# Patient Record
Sex: Female | Born: 1985 | Hispanic: No | Marital: Single | Smoking: Former smoker
Health system: Southern US, Community
[De-identification: ages and names within clinical notes are randomized; demographics above are authoritative.]

## PROBLEM LIST (undated history)

## (undated) DIAGNOSIS — Z789 Other specified health status: Secondary | ICD-10-CM

## (undated) HISTORY — PX: WISDOM TOOTH EXTRACTION: SHX21

---
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
## 2004-03-26 ENCOUNTER — Other Ambulatory Visit: Admission: RE | Admit: 2004-03-26 | Discharge: 2004-03-26 | Payer: Self-pay | Admitting: Obstetrics and Gynecology

## 2005-04-10 ENCOUNTER — Other Ambulatory Visit: Admission: RE | Admit: 2005-04-10 | Discharge: 2005-04-10 | Payer: Self-pay | Admitting: Obstetrics and Gynecology

## 2006-07-07 ENCOUNTER — Other Ambulatory Visit: Admission: RE | Admit: 2006-07-07 | Discharge: 2006-07-07 | Payer: Self-pay | Admitting: Obstetrics and Gynecology

## 2013-08-30 LAB — OB RESULTS CONSOLE HEPATITIS B SURFACE ANTIGEN: Hepatitis B Surface Ag: NEGATIVE

## 2013-08-30 LAB — OB RESULTS CONSOLE RPR: RPR: NONREACTIVE

## 2013-08-30 LAB — OB RESULTS CONSOLE HIV ANTIBODY (ROUTINE TESTING): HIV: NONREACTIVE

## 2013-08-30 LAB — OB RESULTS CONSOLE GC/CHLAMYDIA
Chlamydia: NEGATIVE
GC PROBE AMP, GENITAL: NEGATIVE

## 2013-08-30 LAB — OB RESULTS CONSOLE ANTIBODY SCREEN: ANTIBODY SCREEN: NEGATIVE

## 2013-08-30 LAB — OB RESULTS CONSOLE RUBELLA ANTIBODY, IGM: Rubella: IMMUNE

## 2013-08-30 LAB — OB RESULTS CONSOLE ABO/RH: RH Type: NEGATIVE

## 2013-09-05 ENCOUNTER — Inpatient Hospital Stay (HOSPITAL_COMMUNITY): Admission: AD | Admit: 2013-09-05 | Payer: Self-pay | Source: Ambulatory Visit | Admitting: Obstetrics and Gynecology

## 2013-11-11 ENCOUNTER — Encounter (HOSPITAL_COMMUNITY): Payer: Self-pay

## 2013-11-11 ENCOUNTER — Encounter: Payer: Self-pay | Admitting: Pulmonary Disease

## 2013-11-11 ENCOUNTER — Other Ambulatory Visit (HOSPITAL_COMMUNITY): Payer: Self-pay | Admitting: Obstetrics and Gynecology

## 2013-11-11 ENCOUNTER — Ambulatory Visit (HOSPITAL_COMMUNITY)
Admission: RE | Admit: 2013-11-11 | Discharge: 2013-11-11 | Disposition: A | Discharge status: Home / Self Care | Payer: BC Managed Care – PPO | Source: Ambulatory Visit | Attending: Obstetrics and Gynecology | Admitting: Obstetrics and Gynecology

## 2013-11-11 ENCOUNTER — Ambulatory Visit (HOSPITAL_COMMUNITY)
Admission: RE | Admit: 2013-11-11 | Discharge: 2013-11-11 | Disposition: A | Discharge status: Home / Self Care | Payer: BC Managed Care – PPO | Source: Ambulatory Visit | Attending: Pulmonary Disease | Admitting: Pulmonary Disease

## 2013-11-11 ENCOUNTER — Ambulatory Visit (INDEPENDENT_AMBULATORY_CARE_PROVIDER_SITE_OTHER): Payer: BC Managed Care – PPO | Admitting: Pulmonary Disease

## 2013-11-11 VITALS — BP 138/76 | HR 125 | Temp 98.4°F | Ht 62.0 in | Wt 145.0 lb

## 2013-11-11 DIAGNOSIS — O99891 Other specified diseases and conditions complicating pregnancy: Secondary | ICD-10-CM | POA: Insufficient documentation

## 2013-11-11 DIAGNOSIS — R0789 Other chest pain: Secondary | ICD-10-CM

## 2013-11-11 DIAGNOSIS — J9819 Other pulmonary collapse: Secondary | ICD-10-CM | POA: Insufficient documentation

## 2013-11-11 DIAGNOSIS — R079 Chest pain, unspecified: Secondary | ICD-10-CM | POA: Insufficient documentation

## 2013-11-11 DIAGNOSIS — R911 Solitary pulmonary nodule: Secondary | ICD-10-CM | POA: Insufficient documentation

## 2013-11-11 DIAGNOSIS — R0602 Shortness of breath: Secondary | ICD-10-CM | POA: Insufficient documentation

## 2013-11-11 MED ORDER — IOHEXOL 350 MG/ML SOLN
100.0000 mL | Freq: Once | INTRAVENOUS | Status: AC | PRN
  Administered 2013-11-11: 100 mL via INTRAVENOUS

## 2013-11-11 NOTE — Patient Instructions (Signed)
We will call you with the results of your chest ct chest My pager is [phone] If you don't have a blood clot, then use warm compresses and ibuprofen over the weekend to treat the pain If you have fever, chills, nausea or vomiting, go to the ER

## 2013-11-11 NOTE — Progress Notes (Signed)
Subjective:    Patient ID: [name], female    DOB: 23-Jul-1986, 27 y.o.   MRN: [id number]  HPI  Two days ago [name] was doing OK until later in the evening when she started feeling some discomfort in her right chest.  She started feeling pain in her right side.  She called her OBGYN was told that it was due to her abdomen stretching from pregnancy so she was told to take ibuprofen 600mg .  This did not help.  She also tried tylenol yesterday but this did not help.    She describes that pain as a stabbing pain in the right chest.  It is constant.  Doesn't radiate.  Sitting up makes it better and lying flat makes it worse. Turning, movement, deep breath hurts. Coughing, sneezing hurts.  Has not tried warm compresses.  She does not recall any injury or falls.    She does remember kicking a box the night before, but otherwise no heavy lifting, pushing, pulling, no   No leg pain or swelling and no shortness of breath.  She has not had cough, body aches, fever, chills, or runny nose.  She was helping move some things in the office the day before this started, but she did not do any heavy lifting, pulling, pushing.  She remembers kicking a heavy box but she did not feel any pain at the time.  No falls or trauma.    No past medical history on file.   Family History  Problem Relation Age of Onset  . Breast cancer Maternal Aunt     2 maternal aunts     History   Social History  . Marital Status: Single    Spouse Name: N/A    Number of Children: N/A  . Years of Education: N/A   Occupational History  . Not on file.   Social History Main Topics  . Smoking status: Former Smoker -- 0.25 packs/day for 3 years    Types: Cigarettes    Quit date: 06/24/2013  . Smokeless tobacco: Not on file  . Alcohol Use: No  . Drug Use: No  . Sexual Activity: Not on file   Other Topics Concern  . Not on file   Social History Narrative  . No narrative on file     Not on File   No  outpatient prescriptions prior to visit.   No facility-administered medications prior to visit.      Review of Systems  Constitutional: Negative for fever and unexpected weight change.  HENT: Negative for congestion, dental problem, ear pain, nosebleeds, postnasal drip, rhinorrhea, sinus pressure, sneezing, sore throat and trouble swallowing.   Eyes: Negative for redness and itching.  Respiratory: Positive for chest tightness. Negative for cough, shortness of breath and wheezing.   Cardiovascular: Negative for palpitations and leg swelling.  Gastrointestinal: Negative for nausea and vomiting.  Genitourinary: Negative for dysuria.  Musculoskeletal: Negative for joint swelling.  Skin: Negative for rash.  Neurological: Negative for headaches.  Hematological: Does not bruise/bleed easily.  Psychiatric/Behavioral: Negative for dysphoric mood. The patient is not nervous/anxious.        Objective:   Physical Exam  Filed Vitals:   11/11/13 1552  BP: 138/76  Pulse: 125  Temp: 98.4 F (36.9 C)  TempSrc: Oral  Height: 5\' 2"  (1.575 m)  Weight: 145 lb (65.772 kg)  SpO2: 98%   Gen: well appearing, no acute distress HEENT: NCAT, PERRL, EOMi, OP clear, neck supple without masses PULM:  CTA B; mild tenderness R chest wall tenderness CV: RRR, no mgr, no JVD AB: BS+, soft, nontender, no hsm Ext: warm, no edema, no clubbing, no cyanosis Derm: no rash or skin breakdown Neuro: A&Ox4, CN II-XII intact, strength 5/5 in all 4 extremities       Assessment & Plan:   Chest pain I think that the atelectasis in her right chest is indicative of splinting and not the cause of her pain.  The ddx here is broad, including pulmonary embolism, muscle pain, viral pleurisy, and less likely hepatobiliary problems.  She does not have nausea, vomiting or RUQ tenderness to suggest a hepatobiliary problem.  The most concerning possibility in this pregnant female is a pulmonary embolism.  The abrupt onset  and characteristics of the pain are most concerning for this.   If we rule that out then we can proceed with treating conservatively.  Plan: -CT angio chest to rule out PE -if negative, treat conservatively with warm compresses -if positive, admit for lovenox treatment   Updated Medication List Outpatient Encounter Prescriptions as of 11/11/2013  Medication Sig  . acetaminophen (TYLENOL) 325 MG tablet Take 650 mg by mouth as needed.

## 2013-11-11 NOTE — Assessment & Plan Note (Signed)
I think that the atelectasis in her right chest is indicative of splinting and not the cause of her pain.  The ddx here is broad, including pulmonary embolism, muscle pain, viral pleurisy, and less likely hepatobiliary problems.  She does not have nausea, vomiting or RUQ tenderness to suggest a hepatobiliary problem.  The most concerning possibility in this pregnant female is a pulmonary embolism.  The abrupt onset and characteristics of the pain are most concerning for this.   If we rule that out then we can proceed with treating conservatively.  Plan: -CT angio chest to rule out PE -if negative, treat conservatively with warm compresses -if positive, admit for lovenox treatment

## 2013-11-15 ENCOUNTER — Telehealth: Payer: Self-pay | Admitting: Pulmonary Disease

## 2013-11-15 DIAGNOSIS — R911 Solitary pulmonary nodule: Secondary | ICD-10-CM

## 2013-11-15 NOTE — Telephone Encounter (Signed)
I called [name] today to discuss her CT results which showed no pulmonary embolism but did show a small pulmonary nodule.  She needs a CT chest in one year and a follow up visit with me.  Will cc triage to schedule a 11/2014 appointment with me.  I will order the CT chest.

## 2013-12-08 NOTE — L&D Delivery Note (Signed)
Delivery Note At 9:13 AM a viable and healthy female was delivered via Vaginal, Spontaneous Delivery (Presentation: Left Occiput Anterior).  APGAR: 4, 9; weight 5 lb 7.4 oz (2478 g).   Placenta status: Intact, Spontaneous.  Cord: 3 vessels with the following complications: None.    Anesthesia: Epidural  Episiotomy: None Lacerations: B Labial Suture Repair: 3.0 vicryl rapide Est. Blood Loss (mL): 400cc  Mom to postpartum.  Baby to Couplet care / Skin to Skin.  Stuckert, [name] 03/11/2014, 9:40 AM  Br/A neg/RI/Contra?  D/w pt and FOB r/b/a of circumcision for baby, wish to proceed

## 2014-03-03 LAB — OB RESULTS CONSOLE GBS: STREP GROUP B AG: POSITIVE

## 2014-03-10 ENCOUNTER — Encounter (HOSPITAL_COMMUNITY): Payer: Self-pay

## 2014-03-10 ENCOUNTER — Inpatient Hospital Stay (HOSPITAL_COMMUNITY)
Admission: AD | Admit: 2014-03-10 | Discharge: 2014-03-13 | DRG: 775 | Disposition: A | Discharge status: Home / Self Care | Payer: BC Managed Care – PPO | Source: Ambulatory Visit | Attending: Obstetrics and Gynecology | Admitting: Obstetrics and Gynecology

## 2014-03-10 DIAGNOSIS — O99892 Other specified diseases and conditions complicating childbirth: Secondary | ICD-10-CM | POA: Diagnosis present

## 2014-03-10 DIAGNOSIS — O9989 Other specified diseases and conditions complicating pregnancy, childbirth and the puerperium: Secondary | ICD-10-CM

## 2014-03-10 DIAGNOSIS — IMO0001 Reserved for inherently not codable concepts without codable children: Secondary | ICD-10-CM

## 2014-03-10 DIAGNOSIS — Z87891 Personal history of nicotine dependence: Secondary | ICD-10-CM

## 2014-03-10 DIAGNOSIS — Z22330 Carrier of Group B streptococcus: Secondary | ICD-10-CM

## 2014-03-10 HISTORY — DX: Other specified health status: Z78.9

## 2014-03-10 NOTE — MAU Note (Signed)
Pt reports having ctx on and off all night reports feeling wet as well. 2cm in office yesterday

## 2014-03-11 ENCOUNTER — Encounter (HOSPITAL_COMMUNITY): Payer: BC Managed Care – PPO | Admitting: Anesthesiology

## 2014-03-11 ENCOUNTER — Inpatient Hospital Stay (HOSPITAL_COMMUNITY): Payer: BC Managed Care – PPO | Admitting: Anesthesiology

## 2014-03-11 ENCOUNTER — Encounter (HOSPITAL_COMMUNITY): Payer: Self-pay | Admitting: Anesthesiology

## 2014-03-11 DIAGNOSIS — IMO0001 Reserved for inherently not codable concepts without codable children: Secondary | ICD-10-CM

## 2014-03-11 LAB — CBC
HCT: 34 % — ABNORMAL LOW (ref 36.0–46.0)
HEMOGLOBIN: 11.3 g/dL — AB (ref 12.0–15.0)
MCH: 30.4 pg (ref 26.0–34.0)
MCHC: 33.2 g/dL (ref 30.0–36.0)
MCV: 91.4 fL (ref 78.0–100.0)
Platelets: 230 10*3/uL (ref 150–400)
RBC: 3.72 MIL/uL — ABNORMAL LOW (ref 3.87–5.11)
RDW: 13.7 % (ref 11.5–15.5)
WBC: 18.3 10*3/uL — AB (ref 4.0–10.5)

## 2014-03-11 LAB — RPR: RPR Ser Ql: NONREACTIVE

## 2014-03-11 LAB — ABO/RH: ABO/RH(D): A NEG

## 2014-03-11 MED ORDER — FENTANYL 2.5 MCG/ML BUPIVACAINE 1/10 % EPIDURAL INFUSION (WH - ANES)
14.0000 mL/h | INTRAMUSCULAR | Status: DC | PRN
  Filled 2014-03-11: qty 125

## 2014-03-11 MED ORDER — OXYCODONE-ACETAMINOPHEN 5-325 MG PO TABS
1.0000 | ORAL_TABLET | ORAL | Status: DC | PRN
  Filled 2014-03-11: qty 1

## 2014-03-11 MED ORDER — FLEET ENEMA 7-19 GM/118ML RE ENEM: 1.0000 | ENEMA | RECTAL | Status: DC | PRN

## 2014-03-11 MED ORDER — IBUPROFEN 600 MG PO TABS
600.0000 mg | ORAL_TABLET | Freq: Four times a day (QID) | ORAL | Status: DC
  Administered 2014-03-11 – 2014-03-13 (×8): 600 mg via ORAL
  Filled 2014-03-11 (×9): qty 1

## 2014-03-11 MED ORDER — SENNOSIDES-DOCUSATE SODIUM 8.6-50 MG PO TABS
2.0000 | ORAL_TABLET | ORAL | Status: DC
  Administered 2014-03-11 – 2014-03-13 (×2): 2 via ORAL
  Filled 2014-03-11 (×2): qty 2

## 2014-03-11 MED ORDER — DIBUCAINE 1 % RE OINT: 1.0000 "application " | TOPICAL_OINTMENT | RECTAL | Status: DC | PRN

## 2014-03-11 MED ORDER — DEXTROSE 5 % IV SOLN
2.0000 g | Freq: Two times a day (BID) | INTRAVENOUS | Status: AC
  Administered 2014-03-11 (×2): 2 g via INTRAVENOUS
  Filled 2014-03-11 (×2): qty 2

## 2014-03-11 MED ORDER — LACTATED RINGERS IV SOLN
INTRAVENOUS | Status: DC
  Administered 2014-03-11: 01:00:00 via INTRAVENOUS
  Administered 2014-03-11: 500 mL via INTRAVENOUS

## 2014-03-11 MED ORDER — OXYTOCIN 40 UNITS IN LACTATED RINGERS INFUSION - SIMPLE MED: 1.0000 m[IU]/min | INTRAVENOUS | Status: DC

## 2014-03-11 MED ORDER — WITCH HAZEL-GLYCERIN EX PADS: 1.0000 "application " | MEDICATED_PAD | CUTANEOUS | Status: DC | PRN

## 2014-03-11 MED ORDER — PHENYLEPHRINE 40 MCG/ML (10ML) SYRINGE FOR IV PUSH (FOR BLOOD PRESSURE SUPPORT)
80.0000 ug | PREFILLED_SYRINGE | INTRAVENOUS | Status: DC | PRN
  Filled 2014-03-11: qty 2

## 2014-03-11 MED ORDER — ONDANSETRON HCL 4 MG/2ML IJ SOLN
4.0000 mg | Freq: Four times a day (QID) | INTRAMUSCULAR | Status: DC | PRN
  Administered 2014-03-11: 4 mg via INTRAVENOUS
  Filled 2014-03-11: qty 2

## 2014-03-11 MED ORDER — ACETAMINOPHEN 325 MG PO TABS: 650.0000 mg | ORAL_TABLET | ORAL | Status: DC | PRN

## 2014-03-11 MED ORDER — PRENATAL MULTIVITAMIN CH
1.0000 | ORAL_TABLET | Freq: Every day | ORAL | Status: DC
  Administered 2014-03-11 – 2014-03-13 (×3): 1 via ORAL
  Filled 2014-03-11 (×3): qty 1

## 2014-03-11 MED ORDER — BENZOCAINE-MENTHOL 20-0.5 % EX AERO
1.0000 | INHALATION_SPRAY | CUTANEOUS | Status: DC | PRN
Start: 2014-03-11 — End: 2014-03-13
  Administered 2014-03-11: 1 via TOPICAL
  Filled 2014-03-11: qty 56

## 2014-03-11 MED ORDER — LACTATED RINGERS IV SOLN: 500.0000 mL | Freq: Once | INTRAVENOUS | Status: DC

## 2014-03-11 MED ORDER — DIPHENHYDRAMINE HCL 25 MG PO CAPS: 25.0000 mg | ORAL_CAPSULE | Freq: Four times a day (QID) | ORAL | Status: DC | PRN

## 2014-03-11 MED ORDER — LIDOCAINE HCL (PF) 1 % IJ SOLN
INTRAMUSCULAR | Status: DC | PRN
  Administered 2014-03-11 (×2): 8 mL

## 2014-03-11 MED ORDER — LIDOCAINE HCL (PF) 1 % IJ SOLN
30.0000 mL | INTRAMUSCULAR | Status: AC | PRN
  Administered 2014-03-11: 30 mL via SUBCUTANEOUS
  Filled 2014-03-11: qty 30

## 2014-03-11 MED ORDER — OXYCODONE-ACETAMINOPHEN 5-325 MG PO TABS: 1.0000 | ORAL_TABLET | ORAL | Status: DC | PRN

## 2014-03-11 MED ORDER — FENTANYL 2.5 MCG/ML BUPIVACAINE 1/10 % EPIDURAL INFUSION (WH - ANES)
INTRAMUSCULAR | Status: DC | PRN
  Administered 2014-03-11: 14 mL/h via EPIDURAL

## 2014-03-11 MED ORDER — ZOLPIDEM TARTRATE 5 MG PO TABS: 5.0000 mg | ORAL_TABLET | Freq: Every evening | ORAL | Status: DC | PRN

## 2014-03-11 MED ORDER — PENICILLIN G POTASSIUM 5000000 UNITS IJ SOLR
5.0000 10*6.[IU] | Freq: Once | INTRAVENOUS | Status: AC
  Administered 2014-03-11: 5 10*6.[IU] via INTRAVENOUS
  Filled 2014-03-11: qty 5

## 2014-03-11 MED ORDER — ONDANSETRON HCL 4 MG PO TABS: 4.0000 mg | ORAL_TABLET | ORAL | Status: DC | PRN

## 2014-03-11 MED ORDER — ONDANSETRON HCL 4 MG/2ML IJ SOLN: 4.0000 mg | INTRAMUSCULAR | Status: DC | PRN

## 2014-03-11 MED ORDER — SIMETHICONE 80 MG PO CHEW: 80.0000 mg | CHEWABLE_TABLET | ORAL | Status: DC | PRN

## 2014-03-11 MED ORDER — EPHEDRINE 5 MG/ML INJ
10.0000 mg | INTRAVENOUS | Status: DC | PRN
  Filled 2014-03-11: qty 2

## 2014-03-11 MED ORDER — PENICILLIN G POTASSIUM 5000000 UNITS IJ SOLR
2.5000 10*6.[IU] | INTRAVENOUS | Status: DC
  Administered 2014-03-11: 2.5 10*6.[IU] via INTRAVENOUS
  Filled 2014-03-11 (×3): qty 2.5

## 2014-03-11 MED ORDER — PHENYLEPHRINE 40 MCG/ML (10ML) SYRINGE FOR IV PUSH (FOR BLOOD PRESSURE SUPPORT)
80.0000 ug | PREFILLED_SYRINGE | INTRAVENOUS | Status: DC | PRN
  Filled 2014-03-11: qty 10
  Filled 2014-03-11: qty 2

## 2014-03-11 MED ORDER — LACTATED RINGERS IV SOLN: INTRAVENOUS | Status: DC

## 2014-03-11 MED ORDER — TERBUTALINE SULFATE 1 MG/ML IJ SOLN: 0.2500 mg | Freq: Once | INTRAMUSCULAR | Status: DC | PRN

## 2014-03-11 MED ORDER — OXYTOCIN 40 UNITS IN LACTATED RINGERS INFUSION - SIMPLE MED
62.5000 mL/h | INTRAVENOUS | Status: DC
  Filled 2014-03-11: qty 1000

## 2014-03-11 MED ORDER — BUTORPHANOL TARTRATE 1 MG/ML IJ SOLN
2.0000 mg | INTRAMUSCULAR | Status: DC | PRN
  Administered 2014-03-11 (×2): 2 mg via INTRAVENOUS
  Filled 2014-03-11 (×2): qty 2

## 2014-03-11 MED ORDER — IBUPROFEN 600 MG PO TABS
600.0000 mg | ORAL_TABLET | Freq: Four times a day (QID) | ORAL | Status: DC | PRN
  Administered 2014-03-11: 600 mg via ORAL
  Filled 2014-03-11: qty 1

## 2014-03-11 MED ORDER — EPHEDRINE 5 MG/ML INJ
10.0000 mg | INTRAVENOUS | Status: DC | PRN
  Filled 2014-03-11: qty 2
  Filled 2014-03-11: qty 4

## 2014-03-11 MED ORDER — DIPHENHYDRAMINE HCL 50 MG/ML IJ SOLN: 12.5000 mg | INTRAMUSCULAR | Status: DC | PRN

## 2014-03-11 MED ORDER — LACTATED RINGERS IV SOLN: 500.0000 mL | INTRAVENOUS | Status: DC | PRN

## 2014-03-11 MED ORDER — OXYTOCIN BOLUS FROM INFUSION
500.0000 mL | INTRAVENOUS | Status: DC
  Administered 2014-03-11: 500 mL via INTRAVENOUS

## 2014-03-11 MED ORDER — CITRIC ACID-SODIUM CITRATE 334-500 MG/5ML PO SOLN
30.0000 mL | ORAL | Status: DC | PRN
  Administered 2014-03-11: 30 mL via ORAL
  Filled 2014-03-11 (×2): qty 15

## 2014-03-11 MED ORDER — LANOLIN HYDROUS EX OINT: TOPICAL_OINTMENT | CUTANEOUS | Status: DC | PRN

## 2014-03-11 NOTE — Progress Notes (Signed)
Patient ID: Veronica LeversWhitney V Irwin, female   DOB: [birth year], 28 y.o.   MRN: [id number]  AROM for clear fluid w/o diff/comp 9.5/100/0-+1  Anticipate pushing and SVD soon

## 2014-03-11 NOTE — Anesthesia Preprocedure Evaluation (Signed)
Anesthesia Evaluation  Patient identified by MRN, date of birth, ID band Patient awake    Reviewed: Allergy & Precautions, H&P , NPO status , Patient's Chart, lab work & pertinent test results  Airway Mallampati: II TM Distance: >3 FB Neck ROM: full    Dental no notable dental hx.    Pulmonary former smoker,    Pulmonary exam normal       Cardiovascular negative cardio ROS      Neuro/Psych negative neurological ROS  negative psych ROS   GI/Hepatic negative GI ROS, Neg liver ROS,   Endo/Other  negative endocrine ROS  Renal/GU negative Renal ROS     Musculoskeletal   Abdominal Normal abdominal exam  (+)   Peds  Hematology negative hematology ROS (+)   Anesthesia Other Findings   Reproductive/Obstetrics (+) Pregnancy                           Anesthesia Physical Anesthesia Plan  ASA: II  Anesthesia Plan: Epidural   Post-op Pain Management:    Induction:   Airway Management Planned:   Additional Equipment:   Intra-op Plan:   Post-operative Plan:   Informed Consent: I have reviewed the patients History and Physical, chart, labs and discussed the procedure including the risks, benefits and alternatives for the proposed anesthesia with the patient or authorized representative who has indicated his/her understanding and acceptance.     Plan Discussed with:   Anesthesia Plan Comments:         Anesthesia Quick Evaluation  

## 2014-03-11 NOTE — H&P (Signed)
[name] is a 28 y.o. female G1 at 37wk with ctx and cervical change in MAU.  Relatively uncomplicated PNC except + GBBS.  No LOF, no VB.   . Maternal Medical History:  Reason for admission: Contractions.   Contractions: Frequency: regular.    Fetal activity: Perceived fetal activity is normal.    Prenatal complications: no prenatal complications Prenatal Complications - Diabetes: none.    OB History   Grav Para Term Preterm Abortions TAB SAB Ect Mult Living   1             G1 present + abn pap - nl 2014, no STD  Past Medical History  Diagnosis Date  . Medical history non-contributory    Past Surgical History  Procedure Laterality Date  . Wisdom tooth extraction     Family History: family history includes Breast cancer in her maternal aunt. Social History:  reports that she quit smoking about 8 months ago. Her smoking use included Cigarettes. She has a .75 pack-year smoking history. She does not have any smokeless tobacco history on file. She reports that she does not drink alcohol or use illicit drugs. Meds PNV All NKDA   Prenatal Transfer Tool  Maternal Diabetes: No Genetic Screening: Normal Maternal Ultrasounds/Referrals: Normal Fetal Ultrasounds or other Referrals:  None Maternal Substance Abuse:  Yes:  Type: Smoker quit with pregnancy Significant Maternal Medications:  None Significant Maternal Lab Results:  Lab values include: Group B Strep positive Other Comments:  None  Review of Systems  Constitutional: Negative.   HENT: Negative.   Eyes: Negative.   Respiratory: Negative.   Cardiovascular: Negative.   Gastrointestinal: Negative.   Genitourinary: Negative.   Musculoskeletal: Negative.   Skin: Negative.   Neurological: Negative.   Psychiatric/Behavioral: Negative.     Dilation: 8 Effacement (%): 100 Station: -1 Exam by:: GPayne, RN Blood pressure 116/69, pulse 70, temperature 98.7 F (37.1 C), temperature source Oral, resp. rate 20,  height 5\' 2"  (1.575 m), weight 76.204 kg (168 lb), SpO2 98.00%, unknown if currently breastfeeding. Maternal Exam:  Uterine Assessment: Contraction strength is moderate.  Contraction frequency is regular.   Abdomen: Fundal height is appropriate for gestation.   Estimated fetal weight is 7-7.5#.   Fetal presentation: vertex  Introitus: Normal vulva. Normal vagina.  Pelvis: adequate for delivery.   Cervix: Cervix evaluated by digital exam.     Physical Exam  Constitutional: She is oriented to person, place, and time. She appears well-developed and well-nourished.  HENT:  Head: Normocephalic and atraumatic.  Cardiovascular: Normal rate and regular rhythm.   Respiratory: Effort normal and breath sounds normal. No respiratory distress. She has no wheezes.  GI: Soft. Bowel sounds are normal. She exhibits no distension. There is no tenderness.  Musculoskeletal: Normal range of motion.  Neurological: She is alert and oriented to person, place, and time.  Skin: Skin is warm and dry.  Psychiatric: She has a normal mood and affect. Her behavior is normal.    Prenatal labs: ABO, Rh: A/Negative/-- (09/23 0000) Antibody: Negative (09/23 0000) Rubella: Immune (09/23 0000) RPR: Nonreactive (09/23 0000)  HBsAg: Negative (09/23 0000)  HIV: Non-reactive (09/23 0000)  GBS: Positive (03/27 0000)   Hgb 12.5/Pap WNL/ GC neg/ Chl neg/First Tri and AFP WNL/ glucola 120/Plt 289K  US nl anat, ant plac, female  Tdap 01/25/14 Assessment/Plan: 27yo G1P0 at 37 wk in active labor PCN for gbbs prophylaxis Pitocin prn Expect SVD   Stuckert, Navneet Schmuck Bovard 03/11/2014, 7:35 AM

## 2014-03-11 NOTE — MAU Provider Note (Signed)
S: [name] is a 28 y.o. G1P0 at 7937w1d who presents today with contractions and leaking of fluid. She denies any bleeding prior to coming here. RN asked me to evaluate due to presence of increased bleeding after SVE. Patient states that she was 2cm yesterday in the office.  O: VSS FHT: 115-120, moderate with 15x15 accels, one variable seen at the time of this exam Toco: not registering contractions well, but patient reports q3 mins External: no lesion Vagina: small amount of bloody mucous. Once cleaned out the a swab no active bleeding was seen. No pooling of fluid  Cervix: visually about 4cm with a bag visible at the cervical os.  Uterus: AGA A/P: Heavy bloody show Presumed labor at this time RN with report to attending MD

## 2014-03-11 NOTE — Lactation Note (Signed)
This note was copied from the chart of [name]. Lactation Consultation Note  Patient Name: [name] JXBJY'NToday's Date: 03/11/2014 Reason for consult: Follow-up assessment;Difficult latch;Infant < 6lbs Asked by RN to assist Mom with BF. Mom is concerned that baby will not latch, but acts hungry and was satisfied after was given supplement with formula earlier today. Baby sleepy with my visit. Attempted to get baby to suckle on my finger but lots of biting. Explained to Mom that baby did not appear to be hungry at this visit, however both parents report baby was acting hungry prior to my visit and would suckle on their finger. Mom continues to say "I don't have any milk". LC tried to reassure Mom that colostrum is present, reviewed tummy sizes but Mom does not seem to be reassured with teaching. On exam, Mom right nipple is more erect than the left, both have some aerola edema. The left nipple flattens with breast compression. Demonstrated ways to wake baby and attempted to latch on left breast after Mom pre-pumped. Baby would bite down but would not suckle. Mom keeps reporting "I just want my baby to eat". LC attempted a #20 nipple shield and demonstrated to parents how to pre-load nipple shield with formula to supplement. Also demonstrated how to finger feed using curved tipped syringe EBM or formula. Developed a plan for tonight: Mom would pre-pump and attempt to latch baby, if after 5-10 minutes baby would not latch, use nipple shield. If baby will still not latch, FOB to give supplement via curved tipped syringe while Mom post pumps to encourage milk production. Parents seemed to be pleased with this plan. RN present and aware of plan. RN setting up DEBP for Mom to use to pump every 3 hours for 15 minutes. Guidelines for supplementing with BF reviewed with parents. Early term baby behaviors discussed with parents. Encouraged to keep feedings to 30-45 minutes to conserve energy usage. Mom to  call RN with next feeding for RN to evaluate latch especially if using nipple shield.   Maternal Data    Feeding Feeding Type: Breast Fed Length of feed: 0 min (few sucks)  LATCH Score/Interventions Latch: Repeated attempts needed to sustain latch, nipple held in mouth throughout feeding, stimulation needed to elicit sucking reflex. Intervention(s): Adjust position;Assist with latch;Breast massage;Breast compression  Audible Swallowing: None  Type of Nipple: Flat (left flattens with breast compression) Intervention(s): Hand pump  Comfort (Breast/Nipple): Soft / non-tender     Hold (Positioning): Full assist, staff holds infant at breast  LATCH Score: 4  Lactation Tools Discussed/Used Tools: Nipple Dorris CarnesShields;Pump Nipple shield size: 20 Breast pump type: Double-Electric Breast Pump Initiated by:: KG Date initiated:: 03/11/14   Consult Status Consult Status: Follow-up Date: 03/12/14 Follow-up type: In-patient    Veronica LevinsGranger, [name] 03/11/2014, 11:50 PM

## 2014-03-11 NOTE — Anesthesia Procedure Notes (Signed)
Epidural Patient location during procedure: OB Start time: 03/11/2014 6:50 AM End time: 03/11/2014 6:54 AM  Staffing Anesthesiologist: Leilani AbleHATCHETT, Avalyn Molino Performed by: anesthesiologist   Preanesthetic Checklist Completed: patient identified, surgical consent, pre-op evaluation, timeout performed, IV checked, risks and benefits discussed and monitors and equipment checked  Epidural Patient position: sitting Prep: site prepped and draped and DuraPrep Patient monitoring: continuous pulse ox and blood pressure Approach: midline Location: L3-L4 Injection technique: LOR air  Needle:  Needle type: Tuohy  Needle gauge: 17 G Needle length: 9 cm and 9 Needle insertion depth: 5 cm cm Catheter type: closed end flexible Catheter size: 19 Gauge Catheter at skin depth: 11 cm Test dose: negative and Other  Assessment Sensory level: T9 Events: blood not aspirated, injection not painful, no injection resistance, negative IV test and no paresthesia  Additional Notes Reason for block:procedure for pain

## 2014-03-11 NOTE — Consult Note (Signed)
Neonatology Note:  Attendance at Code Apgar:  Our team responded to a Code Apgar call to room # 167 following NSVD, due to infant with apnea. The requesting physician was Dr. Bovard. The mother is a G1P0 A neg, GBS pos at [redacted] weeks GA. She received more than 2 doses of Pen G prior to delivery. She also received a dose of Fentanyl 5 hours before delivery. ROM occurred 1.5 hours PTD and the fluid was clear. At delivery, the baby cried a few times; he was born with a large gush of amniotic fluid. After being placed on the warming table, he became apneic. The OB nursing staff in attendance gave vigorous stimulation and a Code Apgar was called. Our team arrived at 3 minutes of life, at which time the baby was blue and was being given PPV with bag and mask and chest compressions. We continued PPV for another 30 seconds, at which time the baby was showing resp effort and began to cry. We continued BBO2 for another minute and he pinked up quickly. His left lung was clear, but there were a few crackles on the right, so we did chest PT, after which the right side was also clear. He maintained normal O2 saturations in room air and had no signs of resp distress or periodic breathing. Ap pending assignment by OB nurses at 1 minute/9 at 5 min. I spoke with the parents in the DR, then transferred the baby to the Pediatrician's care.  Veronica Canter C. [name], MD  

## 2014-03-12 LAB — CBC
HCT: 28.5 % — ABNORMAL LOW (ref 36.0–46.0)
HEMOGLOBIN: 9.3 g/dL — AB (ref 12.0–15.0)
MCH: 30.3 pg (ref 26.0–34.0)
MCHC: 32.6 g/dL (ref 30.0–36.0)
MCV: 92.8 fL (ref 78.0–100.0)
Platelets: 201 10*3/uL (ref 150–400)
RBC: 3.07 MIL/uL — ABNORMAL LOW (ref 3.87–5.11)
RDW: 14.3 % (ref 11.5–15.5)
WBC: 13.8 10*3/uL — ABNORMAL HIGH (ref 4.0–10.5)

## 2014-03-12 MED ORDER — RHO D IMMUNE GLOBULIN 1500 UNIT/2ML IJ SOLN
300.0000 ug | Freq: Once | INTRAMUSCULAR | Status: AC
  Administered 2014-03-12: 300 ug via INTRAMUSCULAR
  Filled 2014-03-12: qty 2

## 2014-03-12 NOTE — Progress Notes (Addendum)
Post Partum Day 1 Subjective: no complaints, up ad lib, voiding, tolerating PO and nl lochia, pain controlled  Objective: Blood pressure 96/55, pulse 88, temperature 98.7 F (37.1 C), temperature source Oral, resp. rate 18, height 5\' 2"  (1.575 m), weight 76.204 kg (168 lb), SpO2 96.00%, unknown if currently breastfeeding.  Physical Exam:  General: alert and no distress Lochia: appropriate Uterine Fundus: firm   Recent Labs  03/11/14 0110 03/12/14 0615  HGB 11.3* 9.3*  HCT 34.0* 28.5*    Assessment/Plan: Plan for discharge tomorrow, Breastfeeding and Lactation consult.  Routine PP care.  Baby not latching well, will work on today and plan for circ tomorrow.     LOS: 2 days   Stuckert, [name] Bovard 03/12/2014, 8:03 AM

## 2014-03-12 NOTE — Anesthesia Postprocedure Evaluation (Signed)
Anesthesia Post Note  Patient: [name]  Procedure(s) Performed: * No procedures listed *  Anesthesia type: Epidural  Patient location: Mother/Baby  Post pain: Pain level controlled  Post assessment: Post-op Vital signs reviewed  Last Vitals:  Filed Vitals:   03/12/14 0638  BP: 96/55  Pulse: 88  Temp: 37.1 C  Resp: 18    Post vital signs: Reviewed  Level of consciousness:alert  Complications: No apparent anesthesia complications

## 2014-03-13 LAB — RH IG WORKUP (INCLUDES ABO/RH)
ABO/RH(D): A NEG
Antibody Screen: POSITIVE
DAT, IgG: NEGATIVE
FETAL SCREEN: NEGATIVE
GESTATIONAL AGE(WKS): 37.1
Unit division: 0

## 2014-03-13 MED ORDER — OXYCODONE-ACETAMINOPHEN 5-325 MG PO TABS: 1.0000 | ORAL_TABLET | Freq: Four times a day (QID) | ORAL | Status: AC | PRN

## 2014-03-13 MED ORDER — PRENATAL MULTIVITAMIN CH: 1.0000 | ORAL_TABLET | Freq: Every day | ORAL | Status: AC

## 2014-03-13 MED ORDER — IBUPROFEN 800 MG PO TABS: 800.0000 mg | ORAL_TABLET | Freq: Three times a day (TID) | ORAL | Status: AC | PRN

## 2014-03-13 NOTE — Discharge Summary (Signed)
Obstetric Discharge Summary Reason for Admission: onset of labor Prenatal Procedures: none Intrapartum Procedures: spontaneous vaginal delivery Postpartum Procedures: none Complications-Operative and Postpartum: vaginal laceration Hemoglobin  Date Value Ref Range Status  03/12/2014 9.3* 12.0 - 15.0 g/dL Final     REPEATED TO VERIFY     DELTA CHECK NOTED     HCT  Date Value Ref Range Status  03/12/2014 28.5* 36.0 - 46.0 % Final    Physical Exam:  General: alert and no distress Lochia: appropriate Uterine Fundus: firm  Discharge Diagnoses: Term Pregnancy-delivered  Discharge Information: Date: 03/13/2014 Activity: pelvic rest Diet: routine Medications: PNV, Ibuprofen and Percocet Condition: stable Instructions: refer to practice specific booklet Discharge to: home Follow-up Information   Follow up with Stuckert, Sherron MondayJody Bovard, MD. Schedule an appointment as soon as possible for a visit in 6 weeks. (for post-partum check)    Specialty:  Obstetrics and Gynecology   Contact information:   [address] [place] [id number] [phone]       Newborn Data: Live born female  Birth Weight: 5 lb 7.4 oz (2478 g) APGAR: 4, 9  Home with mother.  Stuckert, Reagan Klemz Bovard 03/13/2014, 6:35 AM

## 2014-03-13 NOTE — Progress Notes (Signed)
Post Partum Day 2 Subjective: no complaints, up ad lib, voiding, tolerating PO and nl lochia, pain controlled  Objective: Blood pressure 120/80, pulse 82, temperature 98.2 F (36.8 C), temperature source Oral, resp. rate 18, height 5\' 2"  (1.575 m), weight 76.204 kg (168 lb), SpO2 96.00%, unknown if currently breastfeeding.  Physical Exam:  General: alert and no distress Lochia: appropriate Uterine Fundus: firm   Recent Labs  03/11/14 0110 03/12/14 0615  HGB 11.3* 9.3*  HCT 34.0* 28.5*    Assessment/Plan: Discharge home, Breastfeeding and Lactation consult.  D/c home with motrin, percocet, and pnv, f/u 6 wks.  circ this am.     LOS: 3 days   Irwin, Veronica Yim Bovard 03/13/2014, 6:30 AM

## 2014-03-14 ENCOUNTER — Ambulatory Visit: Payer: Self-pay

## 2014-03-14 NOTE — Lactation Note (Signed)
This note was copied from the chart of Veronica Neal DyWhitney Burdick. Lactation Consultation Note  Patient Name: Veronica Neal DyWhitney Howington WUJWJ'XToday's Date: 03/14/2014 Reason for consult: Follow-up assessment;Late preterm infant;Difficult latch Mom asking about DEBP, has medela backpack model, explained this is appropriate pump. Mom's breasts are filling. Mom is currently feeding baby EBM with bottle due to difficulty latching baby. Baby sleepy and satisfied from previous bottle. Reviewed BF basics with mom, enc mom to offer baby breast first when showing feeding cues. Reviewed how to achieve a deep latch. Enc mom to tug lower chin after baby latches on as it appears baby upper lip and gums extend naturally past lower lip and gums. Offered mom a follow-up appointment with LC, but mom prefers to call back. Reviewed engorgement prevention/treatment. Referred mom to BF information including storage times in [facility] BF brochure. Enc mom to call with any questions. Consulted with patient's nurse on recommendations. Mom comfortable with hand expression, and aware of OP and BFSG services.  Maternal Data    Feeding Feeding Type:  (Baby just given EBM via bottle.)  LATCH Score/Interventions          Comfort (Breast/Nipple): Filling, red/small blisters or bruises, mild/mod discomfort (Mom's breasts are filling, reviewed engorgement prevention/treatment.)           Lactation Tools Discussed/Used Pump Review: Milk Storage   Consult Status Consult Status: Complete Follow-up type:  (Offered to make follow-up apt, mom prefers to call back after she gets home.)    Nancy NordmannWILLIARD, [name] 03/14/2014, 10:20 AM

## 2014-10-09 ENCOUNTER — Encounter (HOSPITAL_COMMUNITY): Payer: Self-pay | Admitting: Anesthesiology

## 2014-11-16 ENCOUNTER — Ambulatory Visit (INDEPENDENT_AMBULATORY_CARE_PROVIDER_SITE_OTHER)
Admission: RE | Admit: 2014-11-16 | Discharge: 2014-11-16 | Disposition: A | Discharge status: Home / Self Care | Payer: BC Managed Care – PPO | Source: Ambulatory Visit | Attending: Pulmonary Disease | Admitting: Pulmonary Disease

## 2014-11-16 ENCOUNTER — Inpatient Hospital Stay: Admission: RE | Admit: 2014-11-16 | Payer: BC Managed Care – PPO | Source: Ambulatory Visit

## 2014-11-16 DIAGNOSIS — R911 Solitary pulmonary nodule: Secondary | ICD-10-CM

## 2014-11-17 ENCOUNTER — Telehealth: Payer: Self-pay | Admitting: Pulmonary Disease

## 2014-11-17 NOTE — Telephone Encounter (Signed)
Called and spoke with pt and she is aware  That BQ has not reviewed the ct results yet.  Will forward message to him to see about these results.  BQ please advise. Thanks  No Known Allergies  Current Outpatient Prescriptions on File Prior to Visit  Medication Sig Dispense Refill  . acetaminophen (TYLENOL) 325 MG tablet Take 650 mg by mouth as needed for mild pain.     Marland Kitchen. ibuprofen (ADVIL,MOTRIN) 800 MG tablet Take 1 tablet (800 mg total) by mouth every 8 (eight) hours as needed. 45 tablet 1  . oxyCODONE-acetaminophen (PERCOCET/ROXICET) 5-325 MG per tablet Take 1-2 tablets by mouth every 6 (six) hours as needed for severe pain (moderate - severe pain). 15 tablet 0  . Prenatal Vit-Fe Fumarate-FA (PRENATAL MULTIVITAMIN) TABS tablet Take 1 tablet by mouth daily at 12 noon. 30 tablet 12   No current facility-administered medications on file prior to visit.

## 2014-11-23 NOTE — Progress Notes (Signed)
Quick Note:  Pt aware of results and recs. Nothing further needed. ______ 

## 2014-11-23 NOTE — Telephone Encounter (Signed)
Updated by Morrie SheldonAshley 12/17

## 2015-01-14 IMAGING — CT CT ANGIO CHEST
2 of 6 series · 19 of 36 positions shown · IV contrast (OMNIPAQUE)
Comparison: Chest radiographs obtained earlier today.

CLINICAL DATA: Sudden onset right chest pain and shortness of
breath. Twenty weeks pregnant.

EXAM:
CT ANGIOGRAPHY CHEST WITH CONTRAST
TECHNIQUE: Multidetector CT imaging of the chest was performed using the
standard protocol during bolus administration of intravenous
contrast. Multiplanar CT image reconstructions including MIPs were
obtained to evaluate the vascular anatomy.
CONTRAST:  100mL OMNIPAQUE IOHEXOL 350 MG/ML SOLN

[Series 6: pe thins @ 1mm · axial · 0.74mm/px · z∈[-389,-168]mm · 18 of 247 slices shown]
[im 13/247  lung]
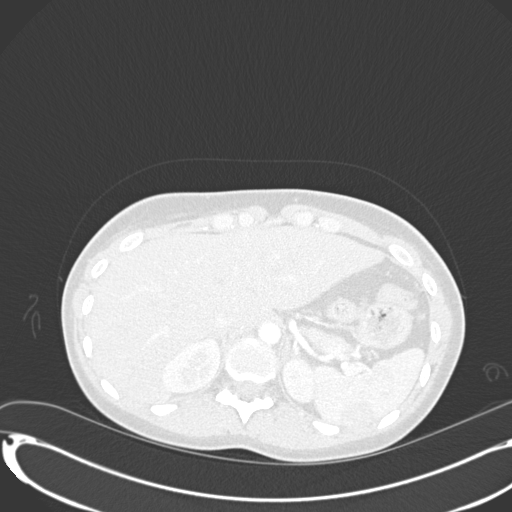
[im 25/247  mediastinal]
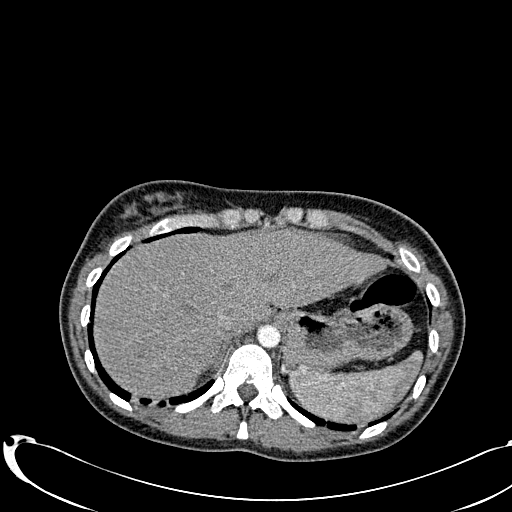
[im 37/247  lung]
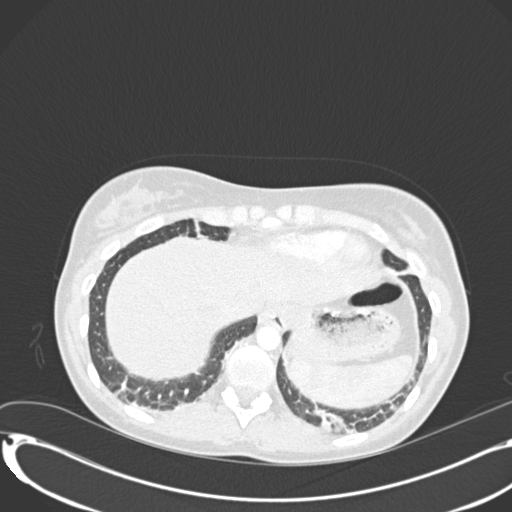
[im 50/247  mediastinal]
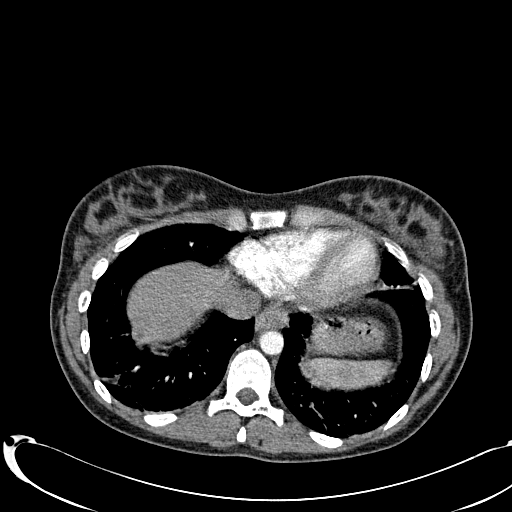
[im 62/247  lung]
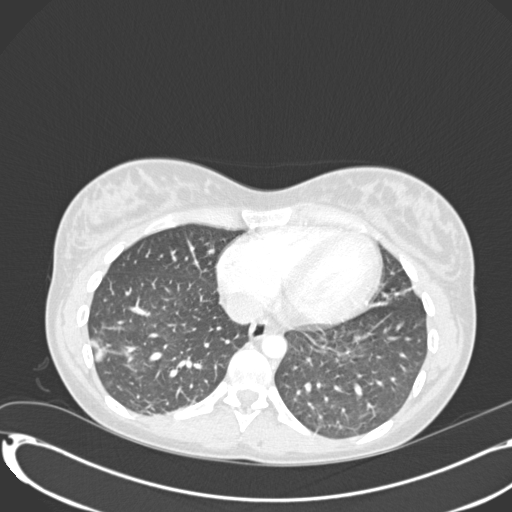
[im 74/247  mediastinal]
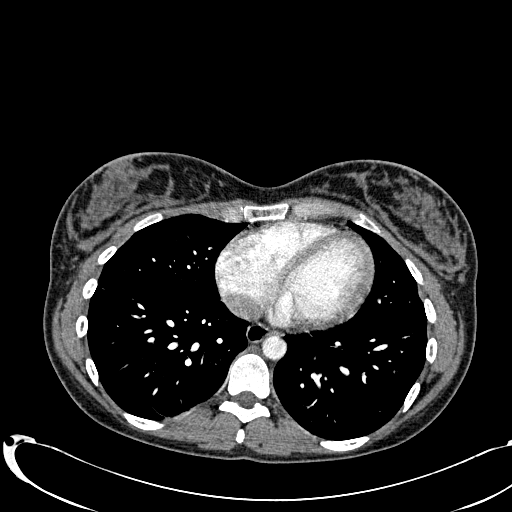
[im 87/247  lung]
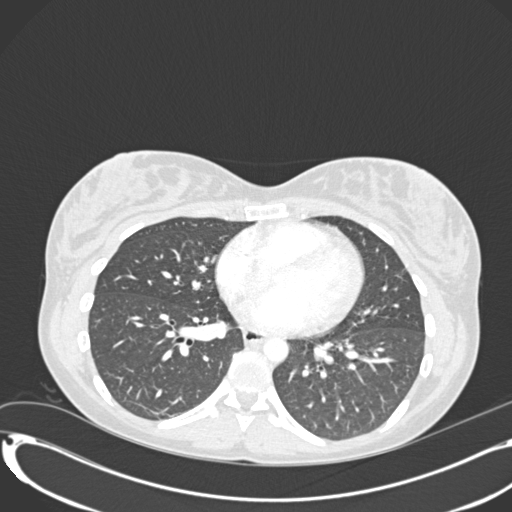
[im 99/247  mediastinal]
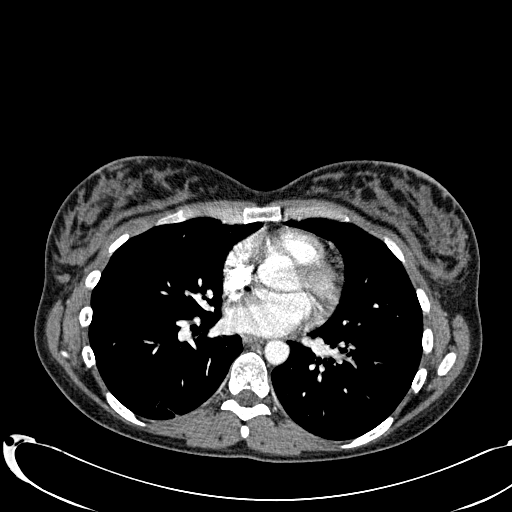
[im 111/247  lung]
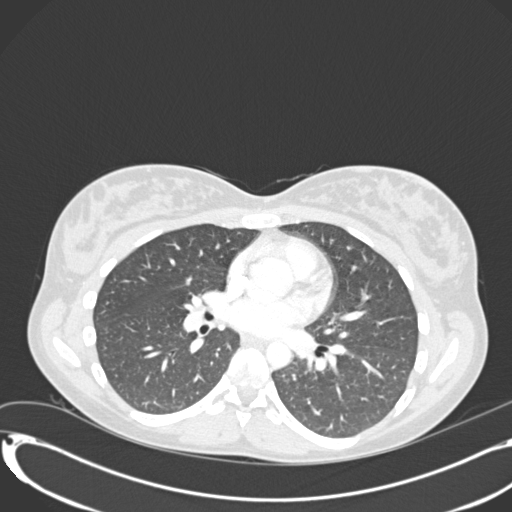
[im 136/247  mediastinal]
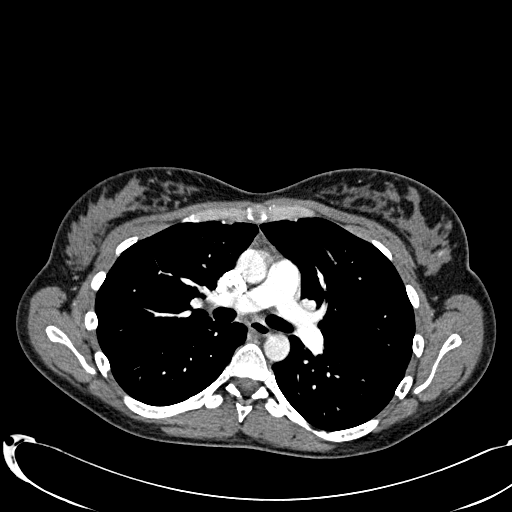
[im 148/247  lung]
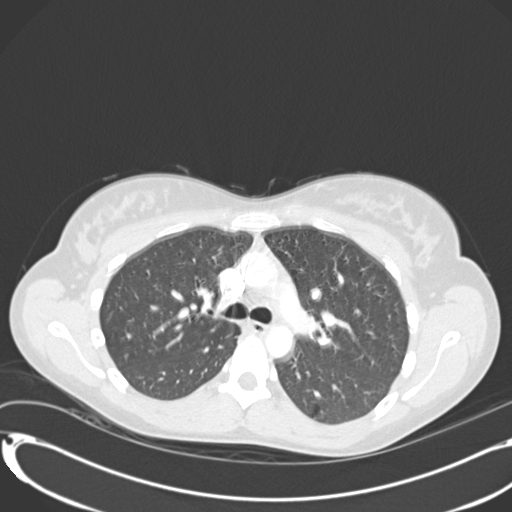
[im 160/247  mediastinal]
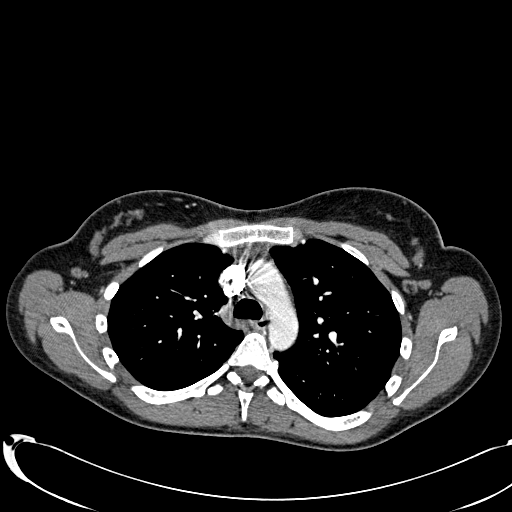
[im 173/247  lung]
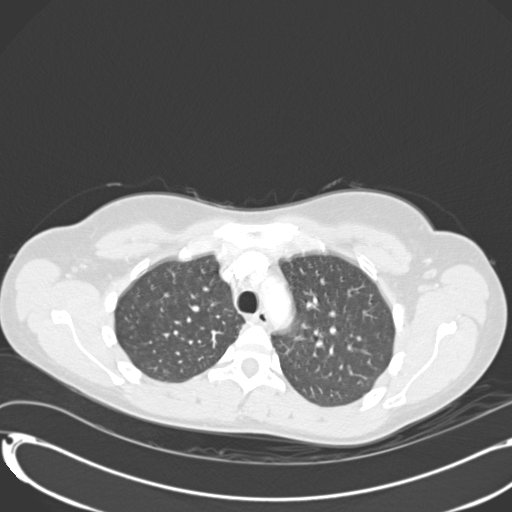
[im 185/247  mediastinal]
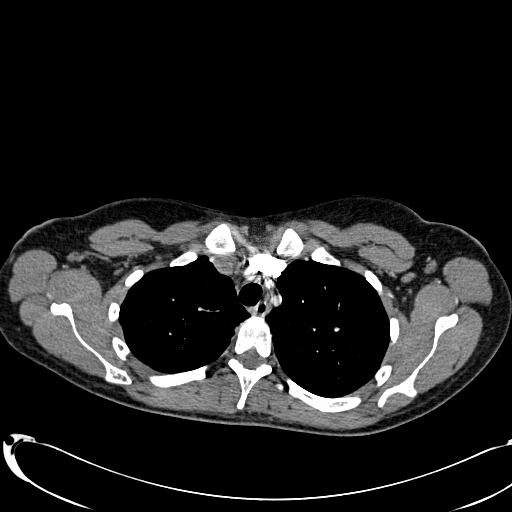
[im 197/247  lung]
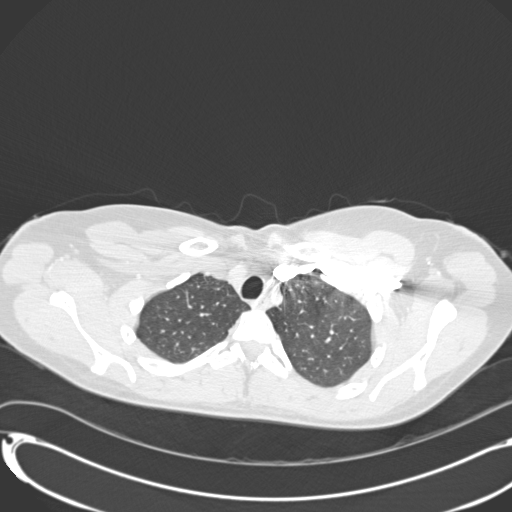
[im 210/247  mediastinal]
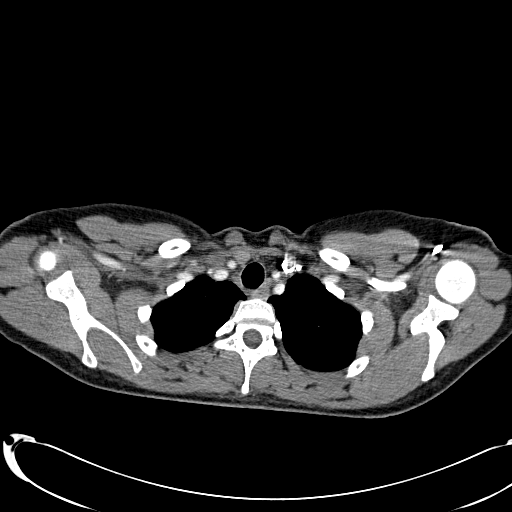
[im 222/247  lung]
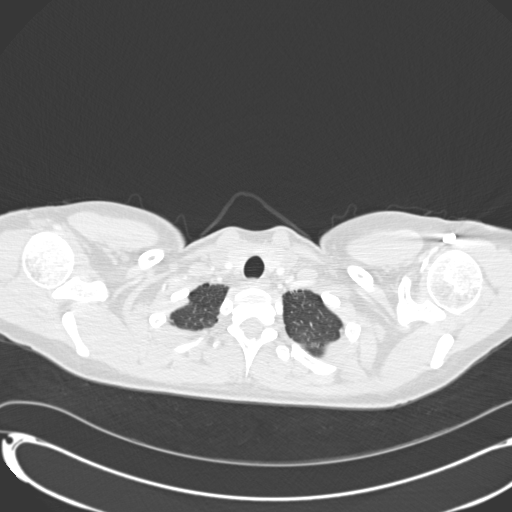
[im 234/247  mediastinal]
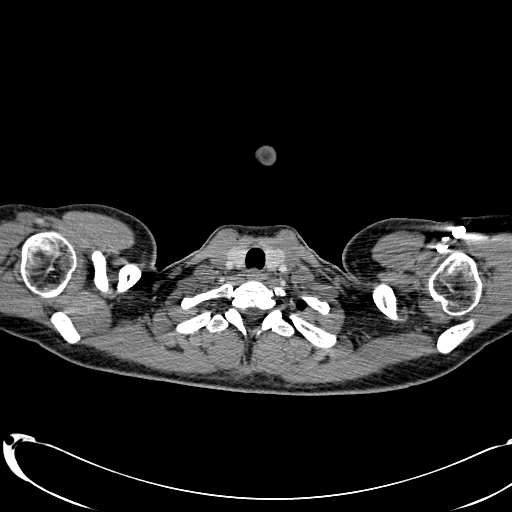

[Series 602: <mpr thick range> · coronal · 0.74mm/px · 1 of 99 slices shown]
[im 50/99  mediastinal]
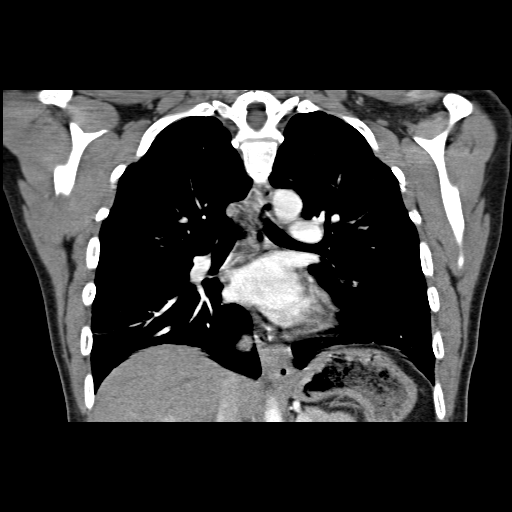

[19 of 36 positions shown; findings below may reference images not displayed]

FINDINGS: Normally opacified pulmonary arteries with no pulmonary arterial
filling defects. Linear densities at both lung bases. Small bulla in
the superior segment of the left lower lobe. Nearby 5 mm
noncalcified nodule in the superior segment of the left lower lobe
on image number 39. No other lung nodules and no enlarged lymph
nodes. Mild thoracic spine degenerative changes. Unremarkable upper
abdomen.

Review of the MIP images confirms the above findings.
IMPRESSION: 1. No pulmonary emboli.
2. Mild bibasilar linear atelectasis or scarring.
3. 5 mm noncalcified left lower lobe nodule. If the patient is at
high risk for bronchogenic carcinoma, follow-up chest CT at 6-12
months is recommended. If the patient is at low risk for
bronchogenic carcinoma, follow-up chest CT at 12 months is
recommended. This recommendation follows the consensus statement:
Guidelines for Management of Small Pulmonary Nodules Detected on CT
Scans: A Statement from the [HOSPITAL] as published in

## 2018-12-08 NOTE — L&D Delivery Note (Signed)
Delivery Note Pt pushed for 84mins and at 5:02 PM a viable female was delivered via Vaginal, Spontaneous (Presentation: ROA;  ).  APGAR: 9, 9; weight pending   Placenta status: delivered intact; duncan, .  Cord:3vc  with the following complications: none.  Cord pH: n/a  Anesthesia: Epidural  Episiotomy:  none Lacerations: None Est. Blood Loss (mL): 100  Mom to postpartum.  Baby to Couplet care / Skin to Skin  Desires circumcision in hospital.  [name] 06/24/2019, 5:16 PM

## 2018-12-15 LAB — OB RESULTS CONSOLE HEPATITIS B SURFACE ANTIGEN: Hepatitis B Surface Ag: NEGATIVE

## 2018-12-15 LAB — OB RESULTS CONSOLE RPR: RPR: NONREACTIVE

## 2018-12-15 LAB — OB RESULTS CONSOLE HIV ANTIBODY (ROUTINE TESTING): HIV: NONREACTIVE

## 2018-12-15 LAB — OB RESULTS CONSOLE GC/CHLAMYDIA
Chlamydia: NEGATIVE
Gonorrhea: NEGATIVE

## 2018-12-15 LAB — OB RESULTS CONSOLE RUBELLA ANTIBODY, IGM: Rubella: IMMUNE

## 2019-06-08 LAB — OB RESULTS CONSOLE GBS: GBS: NEGATIVE

## 2019-06-16 ENCOUNTER — Telehealth (HOSPITAL_COMMUNITY): Payer: Self-pay | Admitting: *Deleted

## 2019-06-16 ENCOUNTER — Encounter (HOSPITAL_COMMUNITY): Payer: Self-pay | Admitting: *Deleted

## 2019-06-16 NOTE — Telephone Encounter (Signed)
Preadmission screen  

## 2019-06-24 ENCOUNTER — Encounter (HOSPITAL_COMMUNITY): Payer: Self-pay | Admitting: *Deleted

## 2019-06-24 ENCOUNTER — Inpatient Hospital Stay (HOSPITAL_COMMUNITY)
Admission: AD | Admit: 2019-06-24 | Discharge: 2019-06-25 | DRG: 807 | Disposition: A | Discharge status: Home / Self Care | Payer: 59 | Attending: Obstetrics and Gynecology | Admitting: Obstetrics and Gynecology

## 2019-06-24 ENCOUNTER — Inpatient Hospital Stay (HOSPITAL_COMMUNITY): Payer: 59 | Admitting: Anesthesiology

## 2019-06-24 ENCOUNTER — Other Ambulatory Visit (HOSPITAL_COMMUNITY)
Admission: RE | Admit: 2019-06-24 | Discharge: 2019-06-24 | Disposition: A | Discharge status: Home / Self Care | Payer: 59 | Source: Ambulatory Visit | Attending: Obstetrics and Gynecology | Admitting: Obstetrics and Gynecology

## 2019-06-24 ENCOUNTER — Other Ambulatory Visit: Payer: Self-pay

## 2019-06-24 DIAGNOSIS — Z1159 Encounter for screening for other viral diseases: Secondary | ICD-10-CM | POA: Diagnosis present

## 2019-06-24 DIAGNOSIS — Z87891 Personal history of nicotine dependence: Secondary | ICD-10-CM

## 2019-06-24 DIAGNOSIS — Z3A38 38 weeks gestation of pregnancy: Secondary | ICD-10-CM | POA: Diagnosis not present

## 2019-06-24 DIAGNOSIS — O26893 Other specified pregnancy related conditions, third trimester: Secondary | ICD-10-CM | POA: Diagnosis present

## 2019-06-24 LAB — CBC
HCT: 36.8 % (ref 36.0–46.0)
Hemoglobin: 12 g/dL (ref 12.0–15.0)
MCH: 30.6 pg (ref 26.0–34.0)
MCHC: 32.6 g/dL (ref 30.0–36.0)
MCV: 93.9 fL (ref 80.0–100.0)
Platelets: 245 10*3/uL (ref 150–400)
RBC: 3.92 MIL/uL (ref 3.87–5.11)
RDW: 14.6 % (ref 11.5–15.5)
WBC: 17.3 10*3/uL — ABNORMAL HIGH (ref 4.0–10.5)
nRBC: 0 % (ref 0.0–0.2)

## 2019-06-24 LAB — POCT FERN TEST: POCT Fern Test: POSITIVE

## 2019-06-24 LAB — SARS CORONAVIRUS 2 (TAT 6-24 HRS): SARS Coronavirus 2: NEGATIVE

## 2019-06-24 LAB — SARS CORONAVIRUS 2 BY RT PCR (HOSPITAL ORDER, PERFORMED IN ~~LOC~~ HOSPITAL LAB): SARS Coronavirus 2: NEGATIVE

## 2019-06-24 MED ORDER — IBUPROFEN 600 MG PO TABS
600.0000 mg | ORAL_TABLET | Freq: Four times a day (QID) | ORAL | Status: DC
  Administered 2019-06-25 (×3): 600 mg via ORAL
  Filled 2019-06-24 (×3): qty 1

## 2019-06-24 MED ORDER — ONDANSETRON HCL 4 MG PO TABS: 4.0000 mg | ORAL_TABLET | ORAL | Status: DC | PRN

## 2019-06-24 MED ORDER — OXYCODONE HCL 5 MG PO TABS: 10.0000 mg | ORAL_TABLET | ORAL | Status: DC | PRN

## 2019-06-24 MED ORDER — OXYCODONE-ACETAMINOPHEN 5-325 MG PO TABS: 1.0000 | ORAL_TABLET | ORAL | Status: DC | PRN

## 2019-06-24 MED ORDER — LACTATED RINGERS IV SOLN
500.0000 mL | Freq: Once | INTRAVENOUS | Status: AC
  Administered 2019-06-24: 500 mL via INTRAVENOUS

## 2019-06-24 MED ORDER — PHENYLEPHRINE 40 MCG/ML (10ML) SYRINGE FOR IV PUSH (FOR BLOOD PRESSURE SUPPORT)
80.0000 ug | PREFILLED_SYRINGE | INTRAVENOUS | Status: DC | PRN
  Filled 2019-06-24 (×2): qty 10

## 2019-06-24 MED ORDER — SOD CITRATE-CITRIC ACID 500-334 MG/5ML PO SOLN: 30.0000 mL | ORAL | Status: DC | PRN

## 2019-06-24 MED ORDER — DIPHENHYDRAMINE HCL 25 MG PO CAPS: 25.0000 mg | ORAL_CAPSULE | Freq: Four times a day (QID) | ORAL | Status: DC | PRN

## 2019-06-24 MED ORDER — DIBUCAINE (PERIANAL) 1 % EX OINT: 1.0000 "application " | TOPICAL_OINTMENT | CUTANEOUS | Status: DC | PRN

## 2019-06-24 MED ORDER — ACETAMINOPHEN 325 MG PO TABS: 650.0000 mg | ORAL_TABLET | ORAL | Status: DC | PRN

## 2019-06-24 MED ORDER — WITCH HAZEL-GLYCERIN EX PADS: 1.0000 "application " | MEDICATED_PAD | CUTANEOUS | Status: DC | PRN

## 2019-06-24 MED ORDER — OXYTOCIN BOLUS FROM INFUSION
500.0000 mL | Freq: Once | INTRAVENOUS | Status: AC
  Administered 2019-06-24: 500 mL via INTRAVENOUS

## 2019-06-24 MED ORDER — EPHEDRINE 5 MG/ML INJ
10.0000 mg | INTRAVENOUS | Status: DC | PRN
  Filled 2019-06-24: qty 2

## 2019-06-24 MED ORDER — FENTANYL-BUPIVACAINE-NACL 0.5-0.125-0.9 MG/250ML-% EP SOLN
12.0000 mL/h | EPIDURAL | Status: DC | PRN
  Filled 2019-06-24: qty 250

## 2019-06-24 MED ORDER — BUTORPHANOL TARTRATE 1 MG/ML IJ SOLN
1.0000 mg | Freq: Once | INTRAMUSCULAR | Status: AC
  Administered 2019-06-24: 1 mg via INTRAVENOUS
  Filled 2019-06-24: qty 1

## 2019-06-24 MED ORDER — DIPHENHYDRAMINE HCL 50 MG/ML IJ SOLN: 12.5000 mg | INTRAMUSCULAR | Status: DC | PRN

## 2019-06-24 MED ORDER — SODIUM CHLORIDE (PF) 0.9 % IJ SOLN
INTRAMUSCULAR | Status: DC | PRN
  Administered 2019-06-24: 12 mL/h via EPIDURAL

## 2019-06-24 MED ORDER — TETANUS-DIPHTH-ACELL PERTUSSIS 5-2.5-18.5 LF-MCG/0.5 IM SUSP: 0.5000 mL | Freq: Once | INTRAMUSCULAR | Status: DC

## 2019-06-24 MED ORDER — LIDOCAINE HCL (PF) 1 % IJ SOLN
INTRAMUSCULAR | Status: DC | PRN
  Administered 2019-06-24 (×2): 5 mL via EPIDURAL

## 2019-06-24 MED ORDER — ONDANSETRON HCL 4 MG/2ML IJ SOLN: 4.0000 mg | INTRAMUSCULAR | Status: DC | PRN

## 2019-06-24 MED ORDER — BENZOCAINE-MENTHOL 20-0.5 % EX AERO: 1.0000 "application " | INHALATION_SPRAY | CUTANEOUS | Status: DC | PRN

## 2019-06-24 MED ORDER — EPHEDRINE 5 MG/ML INJ: 10.0000 mg | INTRAVENOUS | Status: DC | PRN

## 2019-06-24 MED ORDER — PRENATAL MULTIVITAMIN CH
1.0000 | ORAL_TABLET | Freq: Every day | ORAL | Status: DC
  Administered 2019-06-25: 1 via ORAL
  Filled 2019-06-24: qty 1

## 2019-06-24 MED ORDER — LACTATED RINGERS IV SOLN: 500.0000 mL | INTRAVENOUS | Status: DC | PRN

## 2019-06-24 MED ORDER — LACTATED RINGERS IV SOLN: 500.0000 mL | Freq: Once | INTRAVENOUS | Status: DC

## 2019-06-24 MED ORDER — OXYCODONE HCL 5 MG PO TABS: 5.0000 mg | ORAL_TABLET | ORAL | Status: DC | PRN

## 2019-06-24 MED ORDER — ONDANSETRON HCL 4 MG/2ML IJ SOLN: 4.0000 mg | Freq: Four times a day (QID) | INTRAMUSCULAR | Status: DC | PRN

## 2019-06-24 MED ORDER — ZOLPIDEM TARTRATE 5 MG PO TABS: 5.0000 mg | ORAL_TABLET | Freq: Every evening | ORAL | Status: DC | PRN

## 2019-06-24 MED ORDER — COCONUT OIL OIL: 1.0000 "application " | TOPICAL_OIL | Status: DC | PRN

## 2019-06-24 MED ORDER — LACTATED RINGERS IV SOLN
INTRAVENOUS | Status: DC
  Administered 2019-06-24: 14:00:00 via INTRAVENOUS

## 2019-06-24 MED ORDER — SIMETHICONE 80 MG PO CHEW: 80.0000 mg | CHEWABLE_TABLET | ORAL | Status: DC | PRN

## 2019-06-24 MED ORDER — LIDOCAINE HCL (PF) 1 % IJ SOLN: 30.0000 mL | INTRAMUSCULAR | Status: DC | PRN

## 2019-06-24 MED ORDER — FLEET ENEMA 7-19 GM/118ML RE ENEM: 1.0000 | ENEMA | RECTAL | Status: DC | PRN

## 2019-06-24 MED ORDER — SENNOSIDES-DOCUSATE SODIUM 8.6-50 MG PO TABS
2.0000 | ORAL_TABLET | ORAL | Status: DC
  Administered 2019-06-25: 01:00:00 2 via ORAL
  Filled 2019-06-24: qty 2

## 2019-06-24 MED ORDER — OXYTOCIN 40 UNITS IN NORMAL SALINE INFUSION - SIMPLE MED
2.5000 [IU]/h | INTRAVENOUS | Status: DC
  Filled 2019-06-24: qty 1000

## 2019-06-24 MED ORDER — PHENYLEPHRINE 40 MCG/ML (10ML) SYRINGE FOR IV PUSH (FOR BLOOD PRESSURE SUPPORT): 80.0000 ug | PREFILLED_SYRINGE | INTRAVENOUS | Status: DC | PRN

## 2019-06-24 MED ORDER — OXYCODONE-ACETAMINOPHEN 5-325 MG PO TABS: 2.0000 | ORAL_TABLET | ORAL | Status: DC | PRN

## 2019-06-24 NOTE — Anesthesia Procedure Notes (Signed)
Epidural Patient location during procedure: OB Start time: 06/24/2019 2:41 PM End time: 06/24/2019 2:50 PM  Staffing Anesthesiologist: Albertha Ghee, MD Performed: anesthesiologist   Preanesthetic Checklist Completed: patient identified, site marked, pre-op evaluation, timeout performed, IV checked, risks and benefits discussed and monitors and equipment checked  Epidural Patient position: sitting Prep: DuraPrep Patient monitoring: heart rate, cardiac monitor, continuous pulse ox and blood pressure Approach: midline Location: L2-L3 Injection technique: LOR saline  Needle:  Needle type: Tuohy  Needle gauge: 17 G Needle length: 9 cm Needle insertion depth: 5 cm Catheter type: closed end flexible Catheter size: 19 Gauge Catheter at skin depth: 11 cm Test dose: negative and Other  Assessment Events: blood not aspirated, injection not painful, no injection resistance and negative IV test  Additional Notes Informed consent obtained prior to proceeding including risk of failure, 1% risk of PDPH, risk of minor discomfort and bruising.  Discussed rare but serious complications including epidural abscess, permanent nerve injury, epidural hematoma.  Discussed alternatives to epidural analgesia and patient desires to proceed.  Timeout performed pre-procedure verifying patient name, procedure, and platelet count.  Patient tolerated procedure well. Reason for block:procedure for pain

## 2019-06-24 NOTE — Anesthesia Preprocedure Evaluation (Signed)
Anesthesia Evaluation  Patient identified by MRN, date of birth, ID band Patient awake    Reviewed: Allergy & Precautions, H&P , NPO status , Patient's Chart, lab work & pertinent test results  Airway Mallampati: II   Neck ROM: full    Dental   Pulmonary former smoker,    breath sounds clear to auscultation       Cardiovascular negative cardio ROS   Rhythm:regular Rate:Normal     Neuro/Psych    GI/Hepatic   Endo/Other    Renal/GU      Musculoskeletal   Abdominal   Peds  Hematology   Anesthesia Other Findings   Reproductive/Obstetrics (+) Pregnancy                             Anesthesia Physical Anesthesia Plan  ASA: II  Anesthesia Plan: Epidural   Post-op Pain Management:    Induction: Intravenous  PONV Risk Score and Plan: 2 and Treatment may vary due to age or medical condition  Airway Management Planned: Natural Airway  Additional Equipment:   Intra-op Plan:   Post-operative Plan:   Informed Consent: I have reviewed the patients History and Physical, chart, labs and discussed the procedure including the risks, benefits and alternatives for the proposed anesthesia with the patient or authorized representative who has indicated his/her understanding and acceptance.       Plan Discussed with: Anesthesiologist  Anesthesia Plan Comments:         Anesthesia Quick Evaluation  

## 2019-06-24 NOTE — MAU Note (Signed)
Covid swab collected. Pt tolerated well. PT asymptomatic 

## 2019-06-24 NOTE — H&P (Signed)
[name] is a 33 y.o.G2P1001 female presenting at 5138 5/7wks for painful contractions. Pt noted to be 5cm dilated. Admitted for labor. Pt progressed well spontaneously and is now complete and comfortable with epidural. She is dated per LMP which was confirmed with a first trimester US. 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 Measured S> D but US wnl. GBS is negative. Sars covid neg OB History    Gravida  2   Para  1   Term  1   Preterm      AB      Living  1     SAB      TAB      Ectopic      Multiple      Live Births  1          Past Medical History:  Diagnosis Date  . Medical history non-contributory   . SVD (spontaneous vaginal delivery) 03/11/2014   Past Surgical History:  Procedure Laterality Date  . WISDOM TOOTH EXTRACTION     Family History: family history includes Breast cancer in her maternal aunt. Social History:  reports that she quit smoking about 6 years ago. Her smoking use included cigarettes. She has a 0.75 pack-year smoking history. She has never used smokeless tobacco. She reports that she does not drink alcohol or use drugs.     Maternal Diabetes: No Genetic Screening: Abnormal:  Results: Other:see hpi Maternal Ultrasounds/Referrals: Normal Fetal Ultrasounds or other Referrals:  None Maternal Substance Abuse:  No Significant Maternal Medications:  None Significant Maternal Lab Results:  Group B Strep negative Other Comments:  None  Review of Systems  Constitutional: Negative for chills, fever, malaise/fatigue and weight loss.  Eyes: Negative for blurred vision and double vision.  Respiratory: Negative for shortness of breath.   Cardiovascular: Negative for chest pain.  Gastrointestinal: Positive for abdominal pain. Negative for heartburn, nausea and vomiting.  Genitourinary: Negative for dysuria.  Musculoskeletal: Negative for myalgias.  Skin: Negative for itching and rash.  Neurological:  Negative for dizziness and headaches.  Endo/Heme/Allergies: Does not bruise/bleed easily.  Psychiatric/Behavioral: Negative for depression, hallucinations, substance abuse and suicidal ideas. The patient is not nervous/anxious.    Maternal Medical History:  Reason for admission: Contractions.  Nausea.  Contractions: Onset was 3-5 hours ago.    Fetal activity: Perceived fetal activity is normal.   Last perceived fetal movement was within the past hour.    Prenatal Complications - Diabetes: none.    Dilation: 10 Effacement (%): 100 Station: 0, Plus 1 Exam by:: Earlene Plateravis, RN  Blood pressure (!) 120/49, pulse 80, temperature 98.2 F (36.8 C), resp. rate 16, height 5\' 1"  (1.549 m), weight 78 kg, last menstrual period 09/26/2018, unknown if currently breastfeeding. Maternal Exam:  Uterine Assessment: Contraction strength is firm.  Contraction frequency is regular.   Abdomen: Patient reports generalized tenderness.  Estimated fetal weight is AGA.   Fetal presentation: vertex  Introitus: Normal vulva. Vulva is negative for condylomata.  Normal vagina.  Vagina is negative for condylomata.  Amniotic fluid character: meconium stained.  Pelvis: adequate for delivery.   Cervix: Cervix evaluated by digital exam.     Fetal Exam Fetal Monitor Review: Baseline rate: 120.  Pattern: accelerations present.    Fetal State Assessment: Category I - tracings are normal.     Physical Exam  Constitutional: She is oriented to person, place, and time. She appears well-developed and well-nourished.  Neck: Normal range  of motion.  Cardiovascular: Normal rate.  GI: Soft. There is generalized abdominal tenderness.  Genitourinary:    Vulva, vagina and uterus normal.     No vulval condylomata noted.   Musculoskeletal: Normal range of motion.  Neurological: She is alert and oriented to person, place, and time.  Skin: Skin is warm.  Psychiatric: She has a normal mood and affect. Her behavior is  normal. Judgment and thought content normal.    Prenatal labs: ABO, Rh: --/--/A NEG (07/17 1317) Antibody: POS (07/17 1317) Rubella: Immune (01/08 0000) RPR: Nonreactive (01/08 0000)  HBsAg: Negative (01/08 0000)  HIV: Non-reactive (01/08 0000)  GBS: Negative (07/01 0000)   Assessment/Plan: 33yo G2P1001 at 36 5/[redacted]wks gestation in active labor Comfortable with epidural AROM - thin mec stained fluid noted Completely dilated Will start pushing   [name] 06/24/2019, 4:43 PM

## 2019-06-24 NOTE — MAU Note (Signed)
Pt reports her water broke around 11am this morning . Clear fluid out.  tx about q 2-4 min.  Good fetal movement reported

## 2019-06-25 LAB — CBC
HCT: 34 % — ABNORMAL LOW (ref 36.0–46.0)
Hemoglobin: 10.9 g/dL — ABNORMAL LOW (ref 12.0–15.0)
MCH: 30.7 pg (ref 26.0–34.0)
MCHC: 32.1 g/dL (ref 30.0–36.0)
MCV: 95.8 fL (ref 80.0–100.0)
Platelets: 214 10*3/uL (ref 150–400)
RBC: 3.55 MIL/uL — ABNORMAL LOW (ref 3.87–5.11)
RDW: 14.8 % (ref 11.5–15.5)
WBC: 16.5 10*3/uL — ABNORMAL HIGH (ref 4.0–10.5)
nRBC: 0 % (ref 0.0–0.2)

## 2019-06-25 LAB — RPR: RPR Ser Ql: NONREACTIVE

## 2019-06-25 MED ORDER — RHO D IMMUNE GLOBULIN 1500 UNIT/2ML IJ SOSY
300.0000 ug | PREFILLED_SYRINGE | Freq: Once | INTRAMUSCULAR | Status: AC
  Administered 2019-06-25: 300 ug via INTRAVENOUS
  Filled 2019-06-25: qty 2

## 2019-06-25 MED ORDER — IBUPROFEN 600 MG PO TABS: 600.0000 mg | ORAL_TABLET | Freq: Four times a day (QID) | ORAL | 0 refills | Status: AC

## 2019-06-25 NOTE — Discharge Instructions (Signed)
Call office with any complaints [phone] 

## 2019-06-25 NOTE — Progress Notes (Signed)
Patient ID: [name], female   DOB: May 01, 1986, 33 y.o.   MRN: [id number] Pt teary as baby in surgery. Baby noted to have possible volvulus and a few other anatomical abnormalities with bowel. Pt got update while I was in the room - all going well. She reports pain well controlled otherwise. No other complaints VSS GEN - NAD, teary as expected  16.5>10.9<214  A/P: PPD#1 s/p svd -stable         Routine pp care         Will monitor baby progress later today

## 2019-06-25 NOTE — Anesthesia Postprocedure Evaluation (Signed)
Anesthesia Post Note  Patient: [name]  Procedure(s) Performed: AN AD HOC LABOR EPIDURAL     Patient location during evaluation: Mother Baby Anesthesia Type: Epidural Level of consciousness: awake Pain management: satisfactory to patient Vital Signs Assessment: post-procedure vital signs reviewed and stable Respiratory status: spontaneous breathing Cardiovascular status: stable Anesthetic complications: no    Last Vitals:  Vitals:   06/25/19 0024 06/25/19 0430  BP: 111/68 122/69  Pulse: 87 74  Resp: 18 18  Temp: 36.7 C 36.7 C  SpO2: 100% 99%    Last Pain:  Vitals:   06/25/19 0510  TempSrc:   PainSc: 0-No pain   Pain Goal:                   [employer]

## 2019-06-25 NOTE — Progress Notes (Signed)
Patient ID: [name], female   DOB: [birth year], 33 y.o.   MRN: [id number] Pt doing well. Baby is out of surgery and doing well. 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  Pt has met with lactation to help with breastfeeding. Pain is controlled. Lochia is mild. She is requesting discharge today so can stay overnight with baby in NICU VSS ABD - FF EXT - no edema  A/P: PPD#1 s/p svd - stable         Discharge instructions given         Baby to be circumcised when ready per NICU

## 2019-06-25 NOTE — Lactation Note (Signed)
This note was copied from a baby's chart. 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  LC reviewed supply and demand/ importance of consistent pumping around the clock 8-12 times a day, both breast for  15 -20 mins and save the milk. Also consider applying warm moist wash clothes prior to hand expressing and pumping to enhance let down. Mom denies soreness with pumping with the #24 F. Sore nipple and engorgement prevention and tx reviewed. LC reminded to make sure she takes her DEBP Medela kit, basin, soap so she can pump in NICU in front of the baby. [place] reminded mom it can be a slow process with pumping and when she is pumping try not to stare at the pump  Pieces and pump in front of the baby. LC also discussed once a day she could power pump - 2 options- pump 10 mins on 10 mins off over 60 mins or 20 mins on 10 mins off over 60 mins once a day.  LC reviewed breast feeding resources for lactation with [place] and when she is able to breast feed to have her  NICU RN call for [facility] consult. NICU booklet provided.  Per mom has a DEBP at home.     Maternal Data Has patient been taught Hand Expression?: Yes  Feeding    LATCH Score                   Interventions Interventions: Breast feeding basics reviewed;DEBP  Lactation Tools Discussed/Used Tools: Pump Breast pump type: Double-Electric Breast Pump WIC Program: No Pump Review: Setup, frequency, and cleaning;Milk Storage(LC - MAI reviewed)   Consult Status Consult Status: PRN Date: (baby in NICU and mom just pumping - baby post surgery)    Myer Haff 06/25/2019, 3:43 PM

## 2019-06-25 NOTE — Discharge Summary (Signed)
OB Discharge Summary     Patient Name: [name] DOB: 03-17-1986 MRN: [id number]  Date of admission: 06/24/2019 Delivering MD: Pryor OchoaBANGA, Lavoy Bernards [facility]   Date of discharge: 06/25/2019  Admitting diagnosis: WATER BROKE CTX Intrauterine pregnancy: [id number]     Secondary diagnosis:  Active Problems:   Normal labor   Postpartum care following vaginal delivery  Additional problems: none     Discharge diagnosis: Term Pregnancy Delivered                                                                                                Post partum procedures:none  Augmentation: none  Complications: None  Hospital course:  Onset of Labor With Vaginal Delivery     33 y.o. yo [id number] at 8268w5d was admitted in Active Labor on 06/24/2019. Patient had an uncomplicated labor course as follows:  Membrane Rupture Time/Date: 4:40 PM ,06/24/2019   Intrapartum Procedures: Episiotomy: None [1]                                         Lacerations:  None [1]  Patient had a delivery of a Viable infant. 06/24/2019  Information for the patient's newborn:  [name], Boy [name] [[id number]][[id number]]  Delivery Method: Vaginal, Spontaneous(Filed from Delivery Summary)     Pateint had an uncomplicated postpartum course.  She is ambulating, tolerating a regular diet, passing flatus, and urinating well. Patient is discharged home in stable condition on 06/25/19.   Physical exam  Vitals:   06/24/19 1945 06/25/19 0024 06/25/19 0430 06/25/19 1359  BP: 111/62 111/68 122/69 105/62  Pulse: 98 87 74 88  Resp: 18 18 18 18   Temp: 98.3 F (36.8 C) 98.1 F (36.7 C) 98.1 F (36.7 C) 98.8 F (37.1 C)  TempSrc: Oral Oral Oral Oral  SpO2: 97% 100% 99% 99%  Weight:      Height:       General: alert, cooperative and no distress Lochia: appropriate Uterine Fundus: firm Incision: N/A DVT Evaluation: No evidence of DVT seen on physical exam. Labs: Lab Results  Component Value Date   WBC 16.5 (H) 06/25/2019   HGB 10.9 (L)  06/25/2019   HCT 34.0 (L) 06/25/2019   MCV 95.8 06/25/2019   PLT 214 06/25/2019   No flowsheet data found.  Discharge instruction: per After Visit Summary and "Baby and Me Booklet".  After visit meds:  Allergies as of 06/25/2019   No Known Allergies     Medication List    TAKE these medications   acetaminophen 325 MG tablet Commonly known as: TYLENOL Take 650 mg by mouth as needed for mild pain.   ibuprofen 800 MG tablet Commonly known as: ADVIL Take 1 tablet (800 mg total) by mouth every 8 (eight) hours as needed. What changed: Another medication with the same name was added. Make sure you understand how and when to take each.   ibuprofen 600 MG tablet Commonly known as: ADVIL Take 1 tablet (600 mg total) by mouth every 6 (six)  hours. What changed: You were already taking a medication with the same name, and this prescription was added. Make sure you understand how and when to take each.   oxyCODONE-acetaminophen 5-325 MG tablet Commonly known as: PERCOCET/ROXICET Take 1-2 tablets by mouth every 6 (six) hours as needed for severe pain (moderate - severe pain).   prenatal multivitamin Tabs tablet Take 1 tablet by mouth daily at 12 noon.       Diet: routine diet  Activity: Advance as tolerated. Pelvic rest for 6 weeks.   Outpatient follow up:6 weeks Follow up Appt:No future appointments. Follow up Visit:No follow-ups on file.  Postpartum contraception: Not Discussed  Newborn Data: Live born female  Birth Weight: 6 lb 11.6 oz (3050 g) APGAR: 9, 9  Newborn Delivery   Birth date/time: 06/24/2019 17:02:00 Delivery type: Vaginal, Spontaneous      Baby Feeding: Breast Disposition:NICU   06/25/2019 Veronica Serge, DO

## 2019-06-26 LAB — RH IG WORKUP (INCLUDES ABO/RH)
ABO/RH(D): A NEG
Fetal Screen: NEGATIVE
Gestational Age(Wks): 38
Unit division: 0

## 2019-06-27 LAB — TYPE AND SCREEN
ABO/RH(D): A NEG
Antibody Screen: POSITIVE
Unit division: 0
Unit division: 0

## 2019-06-27 LAB — BPAM RBC
Blood Product Expiration Date: 202007282359
Blood Product Expiration Date: 202007292359
Unit Type and Rh: 600
Unit Type and Rh: 600

## 2019-06-27 NOTE — Progress Notes (Signed)
Patient screened out for psychosocial assessment since none of the following apply:  Psychosocial stressors documented in mother or baby's chart  Gestation less than 32 weeks  Code at delivery   Infant with anomalies Please contact the Clinical Social Worker if specific needs arise, by MOB's request, or if MOB scores greater than 9/yes to question 10 on [place] Postpartum Depression Screen.  Juliannah Ohmann, LCSW Clinical Social Worker [facility] Cell#: [phone]     

## 2019-06-28 ENCOUNTER — Inpatient Hospital Stay (HOSPITAL_COMMUNITY): Admission: AD | Admit: 2019-06-28 | Payer: 59 | Source: Home / Self Care | Admitting: Obstetrics and Gynecology

## 2019-06-28 ENCOUNTER — Inpatient Hospital Stay (HOSPITAL_COMMUNITY): Payer: 59
